# Patient Record
Sex: Male | Born: 2000 | Race: Black or African American | Hispanic: No | Marital: Single | Smoking: Never smoker
Health system: Southern US, Community
[De-identification: ages and names within clinical notes are randomized; demographics above are authoritative.]

## PROBLEM LIST (undated history)

## (undated) DIAGNOSIS — J45909 Unspecified asthma, uncomplicated: Secondary | ICD-10-CM

---
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
## 2019-02-15 ENCOUNTER — Encounter (HOSPITAL_COMMUNITY): Payer: Self-pay | Admitting: *Deleted

## 2019-02-15 ENCOUNTER — Emergency Department (HOSPITAL_COMMUNITY)
Admission: EM | Admit: 2019-02-15 | Discharge: 2019-02-15 | Disposition: A | Discharge status: Home / Self Care | Payer: 59 | Attending: Emergency Medicine | Admitting: Emergency Medicine

## 2019-02-15 ENCOUNTER — Other Ambulatory Visit: Payer: Self-pay

## 2019-02-15 DIAGNOSIS — J45909 Unspecified asthma, uncomplicated: Secondary | ICD-10-CM | POA: Diagnosis not present

## 2019-02-15 DIAGNOSIS — Z79899 Other long term (current) drug therapy: Secondary | ICD-10-CM | POA: Insufficient documentation

## 2019-02-15 DIAGNOSIS — R112 Nausea with vomiting, unspecified: Secondary | ICD-10-CM | POA: Insufficient documentation

## 2019-02-15 LAB — CBC WITH DIFFERENTIAL/PLATELET
Abs Immature Granulocytes: 0.01 10*3/uL (ref 0.00–0.07)
BASOS ABS: 0 10*3/uL (ref 0.0–0.1)
Basophils Relative: 1 %
EOS ABS: 0.1 10*3/uL (ref 0.0–0.5)
EOS PCT: 3 %
HCT: 51.1 % (ref 39.0–52.0)
HEMOGLOBIN: 16.8 g/dL (ref 13.0–17.0)
Immature Granulocytes: 0 %
LYMPHS PCT: 32 %
Lymphs Abs: 1.3 10*3/uL (ref 0.7–4.0)
MCH: 28.7 pg (ref 26.0–34.0)
MCHC: 32.9 g/dL (ref 30.0–36.0)
MCV: 87.2 fL (ref 80.0–100.0)
MONO ABS: 0.3 10*3/uL (ref 0.1–1.0)
Monocytes Relative: 8 %
NRBC: 0 % (ref 0.0–0.2)
Neutro Abs: 2.3 10*3/uL (ref 1.7–7.7)
Neutrophils Relative %: 56 %
Platelets: 243 10*3/uL (ref 150–400)
RBC: 5.86 MIL/uL — AB (ref 4.22–5.81)
RDW: 11.8 % (ref 11.5–15.5)
WBC: 4.1 10*3/uL (ref 4.0–10.5)

## 2019-02-15 LAB — BASIC METABOLIC PANEL
Anion gap: 9 (ref 5–15)
BUN: 8 mg/dL (ref 6–20)
CALCIUM: 9.7 mg/dL (ref 8.9–10.3)
CHLORIDE: 103 mmol/L (ref 98–111)
CO2: 26 mmol/L (ref 22–32)
CREATININE: 0.98 mg/dL (ref 0.61–1.24)
GFR calc non Af Amer: >60 mL/min (ref >60)
Glucose, Bld: 70 mg/dL (ref 70–99)
Potassium: 4.2 mmol/L (ref 3.5–5.1)
SODIUM: 138 mmol/L (ref 135–145)

## 2019-02-15 MED ORDER — ONDANSETRON HCL 4 MG PO TABS: 4.0000 mg | ORAL_TABLET | Freq: Four times a day (QID) | ORAL | 0 refills | Status: AC

## 2019-02-15 NOTE — Discharge Instructions (Addendum)
Blood work and EKG were normal.  Increase fluids.  Prescription for nausea medication E prescribed to your pharmacy.  No evidence of a concussion.

## 2019-02-15 NOTE — ED Triage Notes (Signed)
Pt complains of nausea, vomiting, sensitivity to light since passing out last week. Pt broke his tooth during the fall and is unsure if he hit his head.

## 2019-02-15 NOTE — ED Provider Notes (Addendum)
[facility]-EMERGENCY DEPT Provider Note   CSN: [id number] Arrival date & time: 02/15/19  1714    History   Chief Complaint Chief Complaint  Patient presents with  . Nausea  . Emesis  . Headache    HPI Isaac Barron is a 18 y.o. male.     Patient reports a syncopal spell last week.  He blames episode on being dehydrated.  This has never happened before.  He broke a upper central incisor with the fall.  He reports light sensitivity.  No neurological deficits, chest pain, dyspnea, palpitations.  He is normally healthy.  No medications.  No street drugs.     Past Medical History:  Diagnosis Date  . Asthma     There are no active problems to display for this patient.   History reviewed. No pertinent surgical history.      Home Medications    Prior to Admission medications   Medication Sig Start Date End Date Taking? Authorizing Provider  ibuprofen (ADVIL,MOTRIN) 200 MG tablet Take 400 mg by mouth every 6 (six) hours as needed for mild pain.    [provider]  ondansetron (ZOFRAN) 4 MG tablet Take 1 tablet (4 mg total) by mouth every 6 (six) hours. 02/15/19   Donnetta Hutching, MD    Family History No family history on file.  Social History Social History   Tobacco Use  . Smoking status: Never Smoker  . Smokeless tobacco: Never Used  Substance Use Topics  . Alcohol use: No  . Drug use: No     Allergies   Patient has no known allergies.   Review of Systems Review of Systems  All other systems reviewed and are negative.    Physical Exam Updated Vital Signs BP 118/72   Pulse (!) 58   Temp 99.1 F (37.3 C) (Oral)   Resp 19   SpO2 98%   Physical Exam Vitals signs and nursing note reviewed.  Constitutional:      Appearance: He is well-developed.  HENT:     Head: Normocephalic and atraumatic.     Comments: Chipped right central incisor Eyes:     Conjunctiva/sclera: Conjunctivae normal.  Neck:   Musculoskeletal: Neck supple.  Cardiovascular:     Rate and Rhythm: Normal rate and regular rhythm.  Pulmonary:     Effort: Pulmonary effort is normal.     Breath sounds: Normal breath sounds.  Abdominal:     General: Bowel sounds are normal.     Palpations: Abdomen is soft.  Musculoskeletal: Normal range of motion.  Skin:    General: Skin is warm and dry.  Neurological:     Mental Status: He is alert and oriented to person, place, and time.  Psychiatric:        Behavior: Behavior normal.      ED Treatments / Results  Labs (all labs ordered are listed, but only abnormal results are displayed) Labs Reviewed  CBC WITH DIFFERENTIAL/PLATELET - Abnormal; Notable for the following components:      Result Value   RBC 5.86 (*)    All other components within normal limits  BASIC METABOLIC PANEL    EKG EKG Interpretation  Date/Time:  Saturday February 15 2019 18:38:14 EDT Ventricular Rate:  67 PR Interval:    QRS Duration: 88 QT Interval:  392 QTC Calculation: 414 R Axis:   79 Text Interpretation:  Sinus rhythm LVH by voltage ST elev, probable normal early repol pattern Confirmed by Donnetta Hutching ([id number]) on  02/15/2019 7:13:20 PM   Radiology No results found.  Procedures Procedures (including critical care time)  Medications Ordered in ED Medications - No data to display   Initial Impression / Assessment and Plan / ED Course  I have reviewed the triage vital signs and the nursing notes.  Pertinent labs & imaging results that were available during my care of the patient were reviewed by me and considered in my medical decision making (see chart for details).        Patient reports a syncopal spell last week.  Normal neuro exam.  EKG and basic labs normal.  No evidence of a neurological event.  Final Clinical Impressions(s) / ED Diagnoses   Final diagnoses:  Intractable vomiting with nausea, unspecified vomiting type    ED Discharge Orders         Ordered     ondansetron (ZOFRAN) 4 MG tablet  Every 6 hours     02/15/19 1754           Donnetta Hutching, MD 02/15/19 Ninfa Linden    Donnetta Hutching, MD 02/16/19 [phone]

## 2020-11-11 ENCOUNTER — Ambulatory Visit: Payer: Medicaid Other | Attending: Internal Medicine

## 2020-11-11 DIAGNOSIS — Z23 Encounter for immunization: Secondary | ICD-10-CM

## 2020-11-11 NOTE — Progress Notes (Signed)
   Covid-19 Vaccination Clinic  Name:  Isaac Barron    MRN: [id number] DOB: 2001/04/28  11/11/2020  Isaac Barron was observed post Covid-19 immunization for 15 minutes without incident. He was provided with Vaccine Information Sheet and instruction to access the V-Safe system.   Isaac Barron was instructed to call 911 with any severe reactions post vaccine: Marland Kitchen Difficulty breathing  . Swelling of face and throat  . A fast heartbeat  . A bad rash all over body  . Dizziness and weakness   Immunizations Administered    Name Date Dose VIS Date Route   Pfizer COVID-19 Vaccine 11/11/2020  2:21 PM 0.3 mL 09/08/2020 Intramuscular   Manufacturer: [employer], [employer]   Lot: [id number]   NDC: [address]-6

## 2020-12-02 ENCOUNTER — Ambulatory Visit: Payer: Medicaid Other

## 2020-12-14 ENCOUNTER — Emergency Department (HOSPITAL_COMMUNITY)
Admission: EM | Admit: 2020-12-14 | Discharge: 2020-12-14 | Disposition: A | Discharge status: Home / Self Care | Payer: 59 | Attending: Emergency Medicine | Admitting: Emergency Medicine

## 2020-12-14 ENCOUNTER — Encounter (HOSPITAL_COMMUNITY): Payer: Self-pay

## 2020-12-14 ENCOUNTER — Emergency Department (HOSPITAL_COMMUNITY): Payer: 59

## 2020-12-14 DIAGNOSIS — S93401A Sprain of unspecified ligament of right ankle, initial encounter: Secondary | ICD-10-CM | POA: Insufficient documentation

## 2020-12-14 DIAGNOSIS — X501XXA Overexertion from prolonged static or awkward postures, initial encounter: Secondary | ICD-10-CM | POA: Diagnosis not present

## 2020-12-14 DIAGNOSIS — M25579 Pain in unspecified ankle and joints of unspecified foot: Secondary | ICD-10-CM

## 2020-12-14 DIAGNOSIS — Y9367 Activity, basketball: Secondary | ICD-10-CM | POA: Insufficient documentation

## 2020-12-14 DIAGNOSIS — S99911A Unspecified injury of right ankle, initial encounter: Secondary | ICD-10-CM | POA: Diagnosis present

## 2020-12-14 DIAGNOSIS — J45909 Unspecified asthma, uncomplicated: Secondary | ICD-10-CM | POA: Diagnosis not present

## 2020-12-14 NOTE — ED Provider Notes (Signed)
[facility]-EMERGENCY DEPT Provider Note   CSN: [id number] Arrival date & time: 12/14/20  1022     History Chief Complaint  Patient presents with  . Ankle Pain    Harry Shuck is a 20 y.o. male with PMH/o asthma who presents for evaluation of right ankle and foot pain after twisting it yesterday. He reports he was playing basketball and states that he thinks he twisted it and then his friend fell on top of it. He reports pain to the lateral malleolus and dorsal foot since then. He has been able to ambulate but reports worsening pain with ambulation. He elevated, applied ice and took ibuprofen last night. He denies any numbness, weakness, knee pain.   The history is provided by the patient.       Past Medical History:  Diagnosis Date  . Asthma     There are no problems to display for this patient.   History reviewed. No pertinent surgical history.     History reviewed. No pertinent family history.  Social History   Tobacco Use  . Smoking status: Never Smoker  . Smokeless tobacco: Never Used  Substance Use Topics  . Alcohol use: No  . Drug use: No    Home Medications Prior to Admission medications   Medication Sig Start Date End Date Taking? Authorizing Provider  ibuprofen (ADVIL,MOTRIN) 200 MG tablet Take 400 mg by mouth every 6 (six) hours as needed for mild pain.    [provider]  ondansetron (ZOFRAN) 4 MG tablet Take 1 tablet (4 mg total) by mouth every 6 (six) hours. 02/15/19   Donnetta Hutching, MD    Allergies    Patient has no known allergies.  Review of Systems   Review of Systems  Musculoskeletal:       Ankle pain  Neurological: Negative for weakness and numbness.  All other systems reviewed and are negative.   Physical Exam Updated Vital Signs BP 121/89   Pulse 68   Temp 98 F (36.7 C) (Oral)   Resp 18   Ht 5\' 7"  (1.702 m)   Wt 59 kg   SpO2 99%   BMI 20.36 kg/m   Physical Exam Vitals and nursing note  reviewed.  Constitutional:      Appearance: He is well-developed and well-nourished.  HENT:     Head: Normocephalic and atraumatic.  Eyes:     Extraocular Movements: EOM normal.  Cardiovascular:     Pulses:          Dorsalis pedis pulses are 2+ on the right side and 2+ on the left side.  Pulmonary:     Effort: Pulmonary effort is normal.  Musculoskeletal:     Cervical back: Normal range of motion.     Comments: Tenderness palpation of the lateral malleolus of the right ankle.  There are some mild overlying soft tissue swelling.  No deformity, crepitus noted.  No edema, erythema.  Dorsiflexion plantarflexion intact.  No bony tenderness of the distal tib-fib, proximal tib-fib, knee.  Mild tenderness in the dorsal aspect of the right foot.  No overlying warmth, erythema.  No deformity or crepitus noted.  He can fully flex and extend all 5 digits without difficulty.  Skin:    General: Skin is warm and dry.     Capillary Refill: Capillary refill takes less than 2 seconds.     Comments: The skin is intact to ankle/foot.  The foot is warm and well perfused with intact sensation  Neurological:  Comments: Sensation intact throughout all major nerve distributions of the feet      ED Results / Procedures / Treatments   Labs (all labs ordered are listed, but only abnormal results are displayed) Labs Reviewed - No data to display  EKG None  Radiology DG Ankle Complete Right  Result Date: 12/14/2020 CLINICAL DATA:  20 year old male with history of right-sided foot and ankle pain since yesterday. EXAM: RIGHT ANKLE - COMPLETE 3+ VIEW COMPARISON:  No priors. FINDINGS: There is no evidence of fracture, dislocation, or joint effusion. There is no evidence of arthropathy or other focal bone abnormality. Soft tissues are unremarkable. IMPRESSION: Negative. Electronically Signed   By: Trudie Reed M.D.   On: 12/14/2020 11:18   DG Foot Complete Right  Result Date: 12/14/2020 CLINICAL DATA:   Foot and ankle pain. EXAM: RIGHT FOOT COMPLETE - 3+ VIEW COMPARISON:  No prior. FINDINGS: There is no evidence of fracture or dislocation. There is no evidence of arthropathy or other focal bone abnormality. Soft tissues are unremarkable. IMPRESSION: No acute bony or joint abnormality. Electronically Signed   By: Maisie Fus  Register   On: 12/14/2020 11:18    Procedures Procedures   Medications Ordered in ED Medications - No data to display  ED Course  I have reviewed the triage vital signs and the nursing notes.  Pertinent labs & imaging results that were available during my care of the patient were reviewed by me and considered in my medical decision making (see chart for details).    MDM Rules/Calculators/A&P                           20 y.o. Judie Petit Presents with right ankle pain onsistent with an ankle sprain/strain.  Vital signs reviewed and stable. Patient is neurovascularly intact. Consider sprain vs fracture vs dislocation.  XRs ordered.   XR reviewed. Negative for any acute fracture or dislocation. Explained results to patient and discussed that there could be a ligamentous or muscular injury that cannot be picked up on XR. Plan to send patient home with a ASO splint and crutches and will provide ortho referral to be seen if there is no improvement in symptoms. At this time, patient exhibits no emergent life-threatening condition that require further evaluation in ED. Patient had ample opportunity for questions and discussion. All patient's questions were answered with full understanding. Strict return precautions discussed. Patient expresses understanding and agreement to plan.   Portions of this note were generated with Scientist, clinical (histocompatibility and immunogenetics). Dictation errors may occur despite best attempts at proofreading.  Final Clinical Impression(s) / ED Diagnoses Final diagnoses:  Pain in joint, ankle and foot  Sprain of right ankle, unspecified ligament, initial encounter    Rx / DC  Orders ED Discharge Orders    None       Rosana Hoes 12/14/20 1708    Linwood Dibbles, MD 12/15/20 1002

## 2020-12-14 NOTE — ED Triage Notes (Signed)
Pt arrived via walk in, c/o right ankle pain, states he rolled his ankle yesterday while playing basketball, someone fell on it. Denies any other issues. Painful with ambulation, (+CSM), no visible deformity.

## 2020-12-14 NOTE — ED Notes (Signed)
An After Visit Summary was printed and given to the patient. Discharge instructions given and no further questions at this time.  

## 2020-12-14 NOTE — Discharge Instructions (Signed)
Follow up with your Primary Care Doctor as needed.   You can take Tylenol or Ibuprofen as directed for pain. You can alternate Tylenol and Ibuprofen every 4 hours. If you take Tylenol at 1pm, then you can take Ibuprofen at 5pm. Then you can take Tylenol again at 9pm. Do not exceed 4000 mg of tylenol a day. Do not exceed 800 mg of ibuprofen a day.     Return to the Emergency Department immediately for any worsening pain, redness/swelling of the ankle, gray or blue color to the toes, numbness/weakness of toes or foot, difficulty walking or any other worsening or concerning symptoms.    Ankle sprain Ankle sprain occurs when the ligaments that hold the ankle joint to get her are stretched or torn. It may take 4-6 weeks to heal.  For activity: Use crutches with nonweightbearing for the first few days. Then, you may walk on your ankles as the pain allows, or as instructed. Start gradually with weight bearing on the affected ankle. Once you can walk pain free, then try jogging. When you can run forwards, then you can try moving side to side. If you cannot walk without crutches in one week, you need a recheck by your Family Doctor.  If you do not have a family doctor to followup with, you can see the list of phone numbers below. Please call today to make a followup appointment.   RICE therapy:  Routine Care for injuries  Rest, Ice, Compression, Elevation (RICE)  Rest is needed to allow your body to heal. Routine activities can be resumed when comfortable. Injury tendons and bones can take up to 6 weeks to heal. Tendons are cordlike structures that attach muscles and bones.  Ice following an injury helps keep the swelling down and reduce the pain. Put ice in a plastic bag. Place a towel between your skin and the bag of ice. Leave the ice on for 15-20 minutes, 3-4 times a day. Do this while awake, for the first 24-48 hours. After that continue as directed by your caregiver.  Compression helps keep  swelling down. It also gives support and helps with discomfort. If any lasting bandage has been applied, it should be removed and reapplied every 3-4 hours. It should not be applied tightly, but firmly enough to keep swelling down. Watch fingers or toes for swelling, discoloration, coldness, numbness or excessive pain. If any of these problems occur, removed the bandage and reapply loosely. Contact your caregiver if these problems continue.  Elevation helps reduce swelling and decrease your pain. With extremities such as the arms, hands, legs and feet, the injured area should be placed near or above the level of the heart if possible.   

## 2021-01-13 ENCOUNTER — Ambulatory Visit: Payer: Medicaid Other | Attending: Family

## 2021-01-13 DIAGNOSIS — Z23 Encounter for immunization: Secondary | ICD-10-CM

## 2021-04-28 NOTE — Progress Notes (Signed)
   Covid-19 Vaccination Clinic  Name:  Isaac Barron    MRN: [id number] DOB: 18-Dec-2000  04/28/2021  [name] was observed post Covid-19 immunization for 15 minutes without incident. He was provided with Vaccine Information Sheet and instruction to access the V-Safe system.   [name] was instructed to call 911 with any severe reactions post vaccine: Difficulty breathing  Swelling of face and throat  A fast heartbeat  A bad rash all over body  Dizziness and weakness   Immunizations Administered     Name Date Dose VIS Date Route   Pfizer COVID-19 Vaccine 01/13/2021  9:00 AM 0.3 mL 09/08/2020 Intramuscular   Manufacturer: [employer], [employer]   Lot: [id number]   NDC: [address]-6

## 2021-11-26 IMAGING — CR DG FOOT COMPLETE 3+V*R*
3 series · 3 of 3 positions shown · non-contrast
Comparison: No prior.

CLINICAL DATA: Foot and ankle pain.

EXAM:
RIGHT FOOT COMPLETE - 3+ VIEW

[x foot ap right]
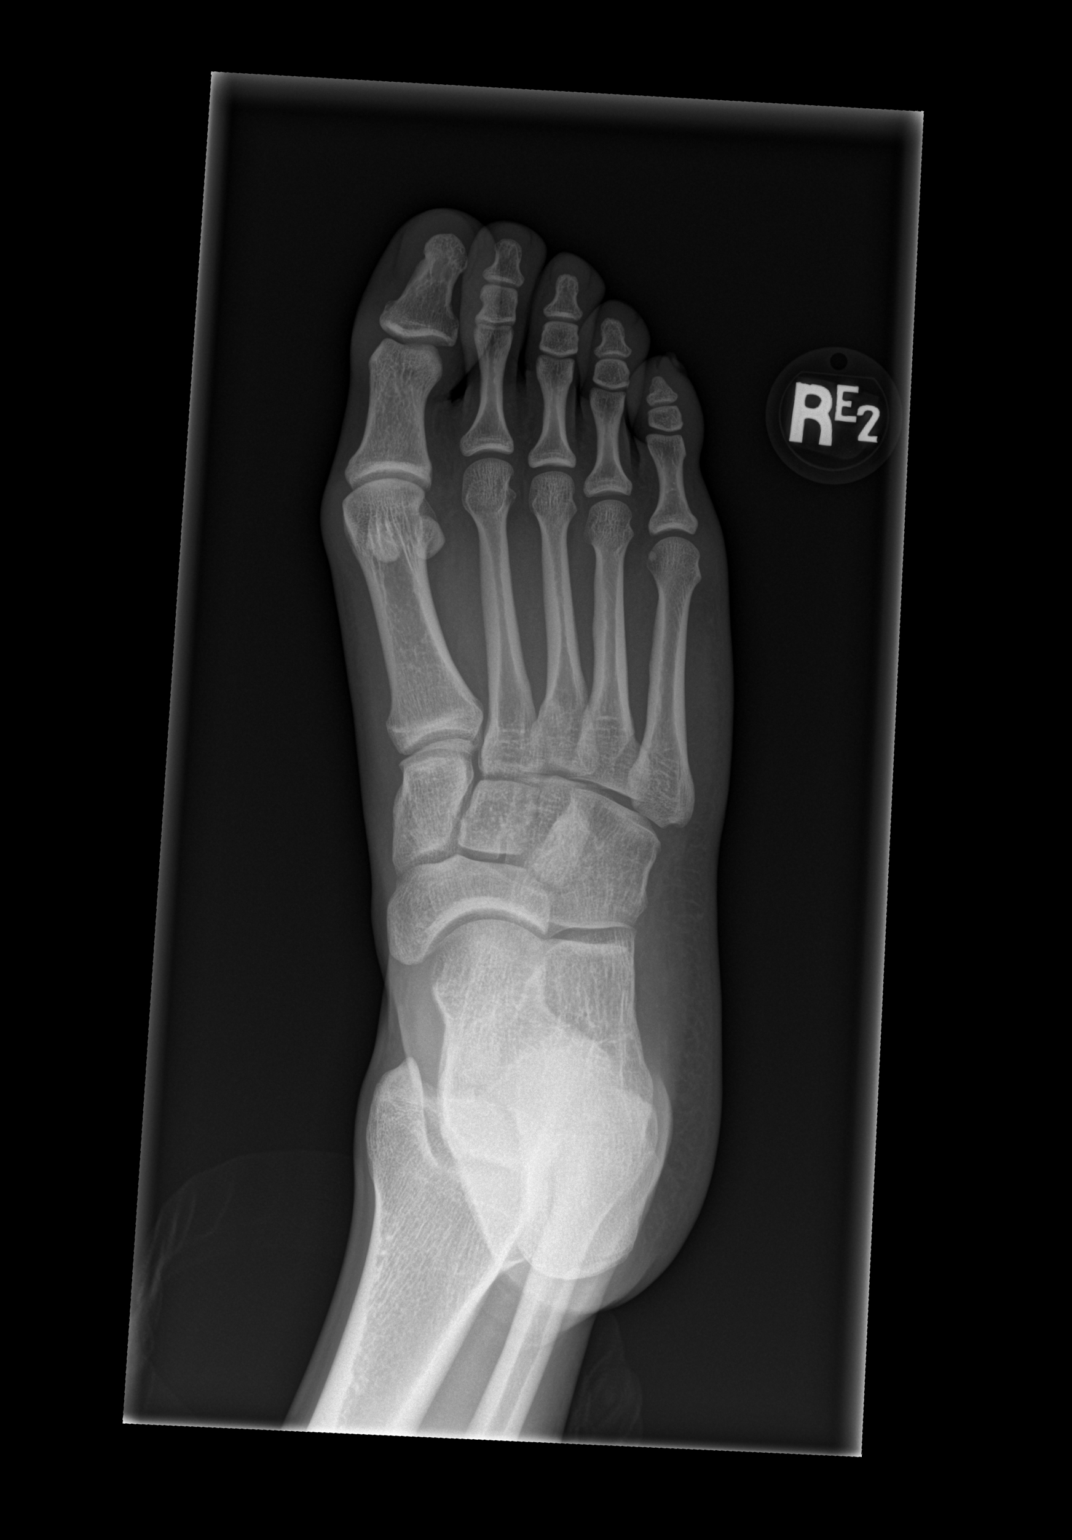

[x foot obl right]
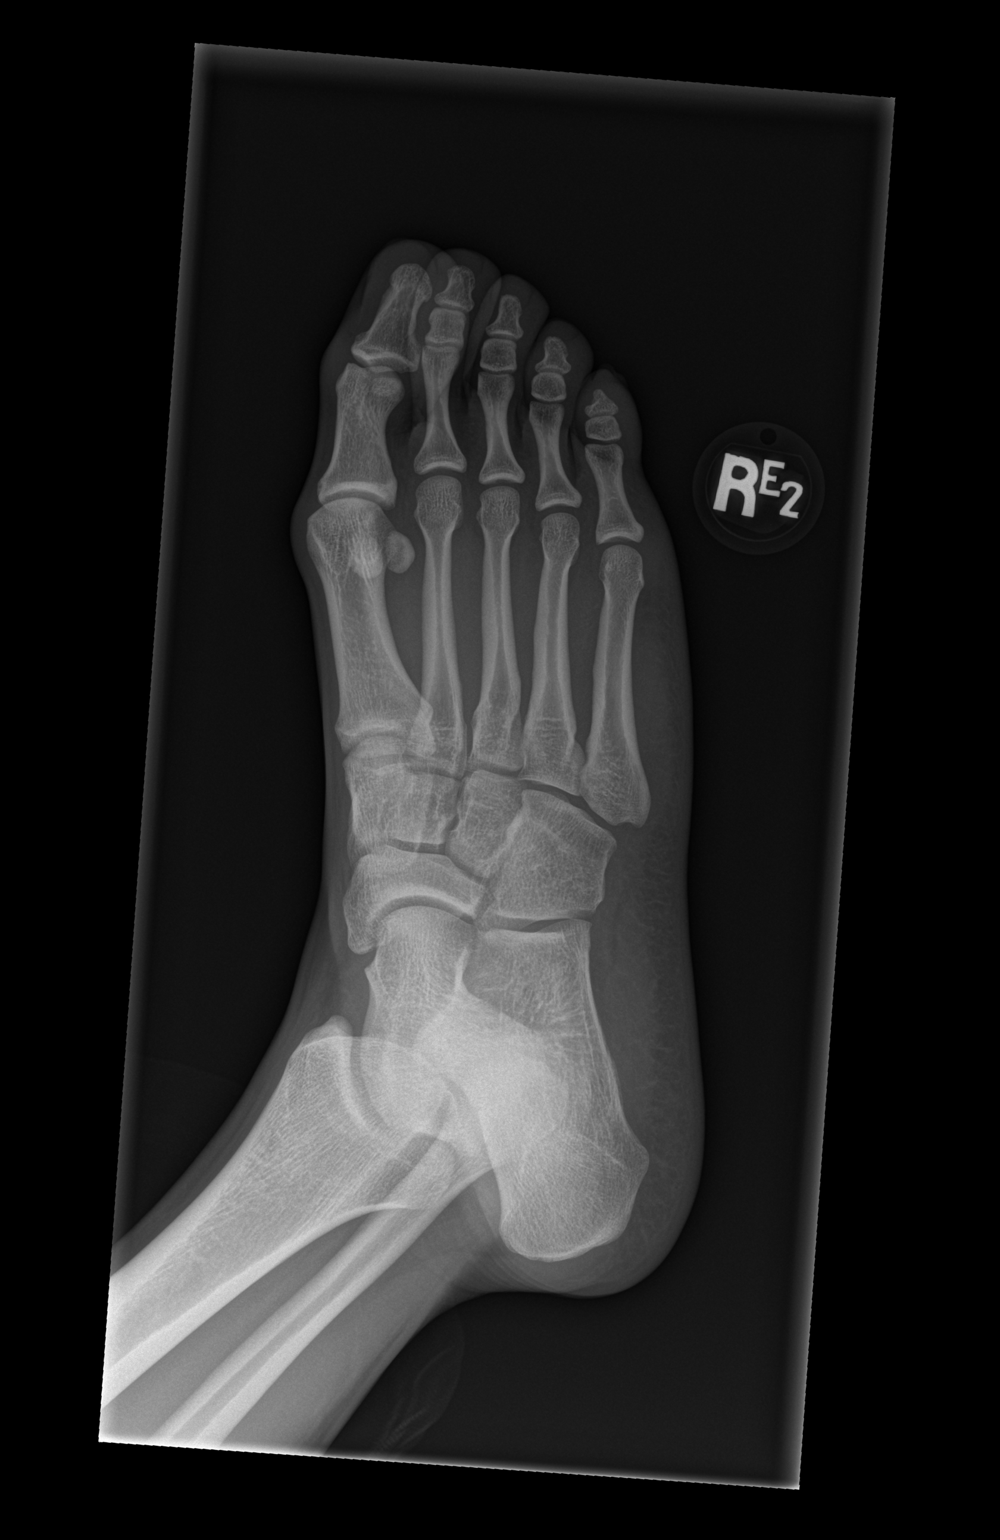

[x foot lat right]
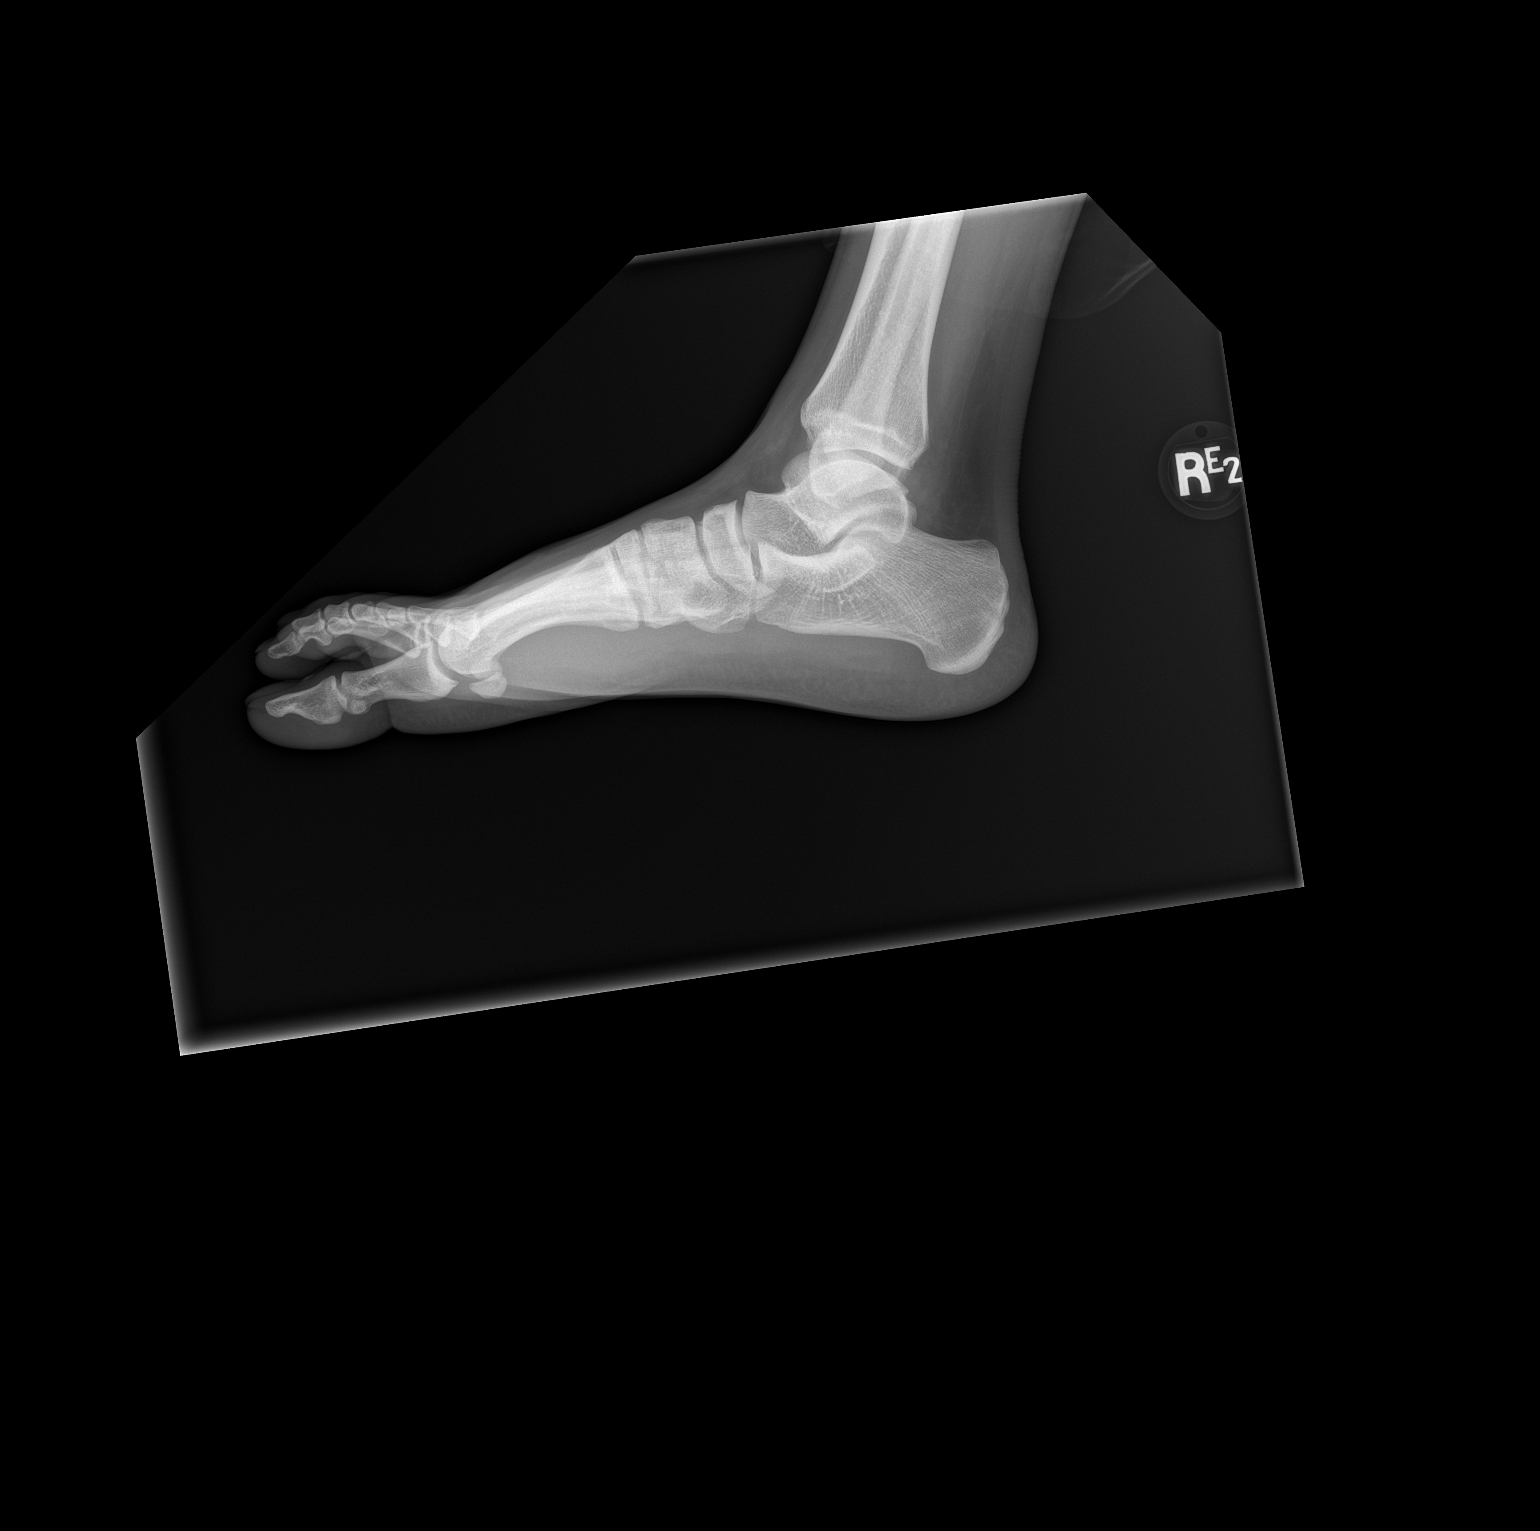

[3 of 3 positions shown; findings below may reference images not displayed]

FINDINGS: There is no evidence of fracture or dislocation. There is no
evidence of arthropathy or other focal bone abnormality. Soft
tissues are unremarkable.
IMPRESSION: No acute bony or joint abnormality.

## 2021-11-26 IMAGING — CR DG ANKLE COMPLETE 3+V*R*
3 series · 3 of 3 positions shown · non-contrast
Comparison: No priors.

CLINICAL DATA: 19-year-old male with history of right-sided foot
and ankle pain since yesterday.

EXAM:
RIGHT ANKLE - COMPLETE 3+ VIEW

[x ankle ap right]
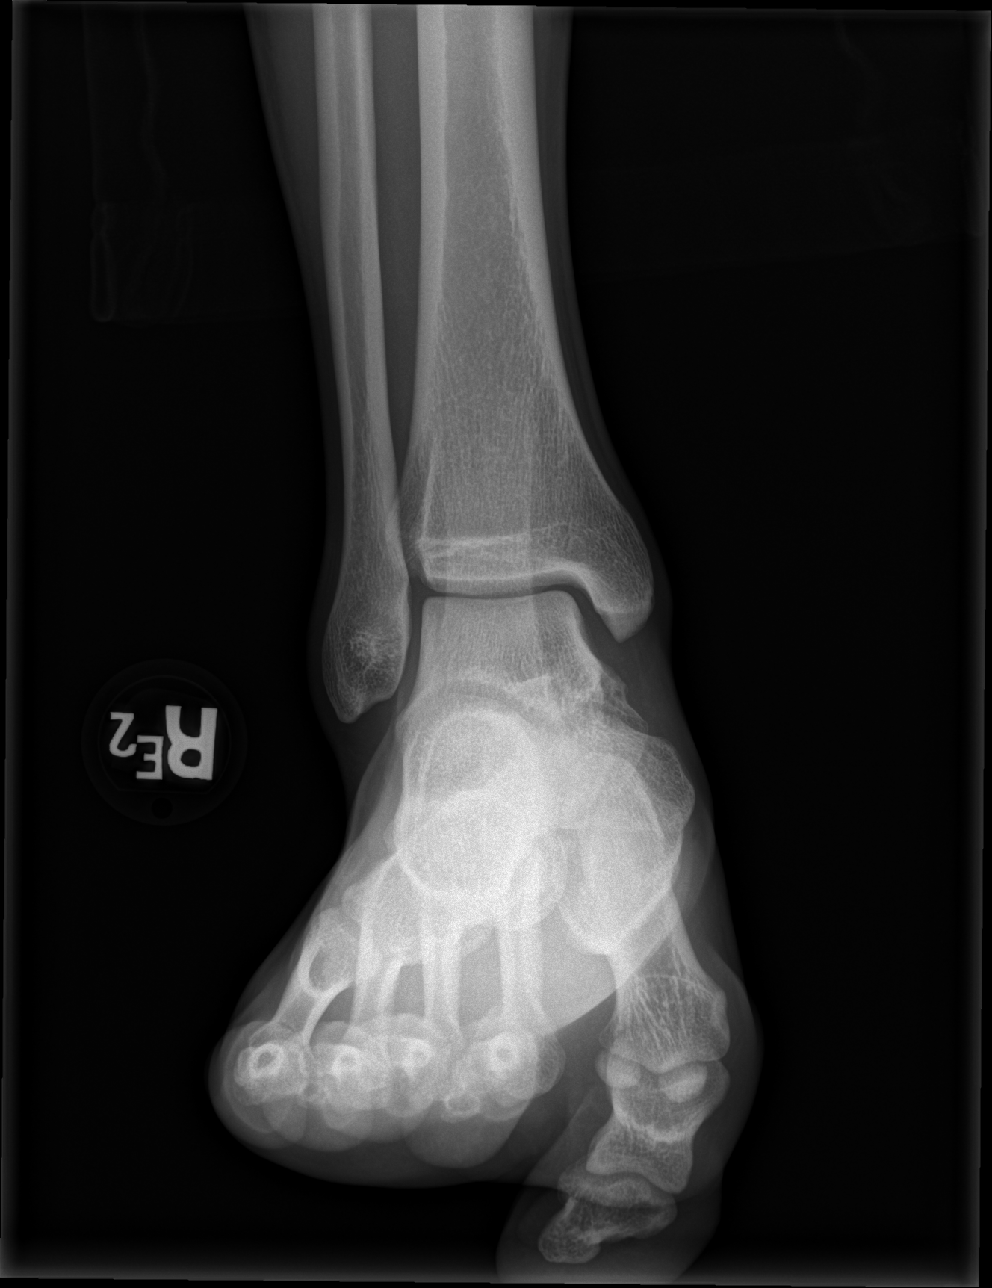

[x ankle obl right]
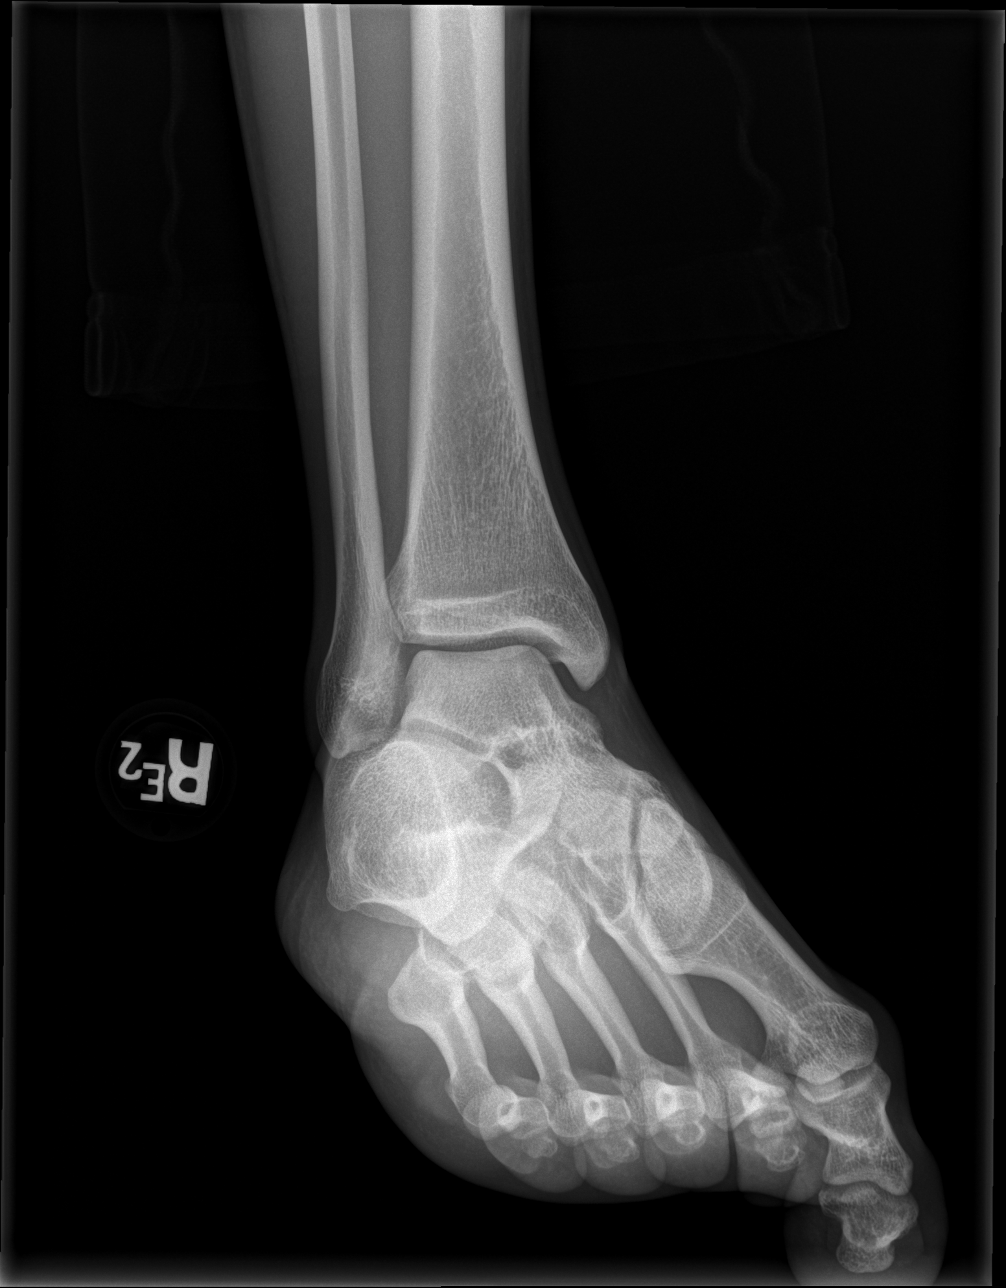

[x ankle lat right]
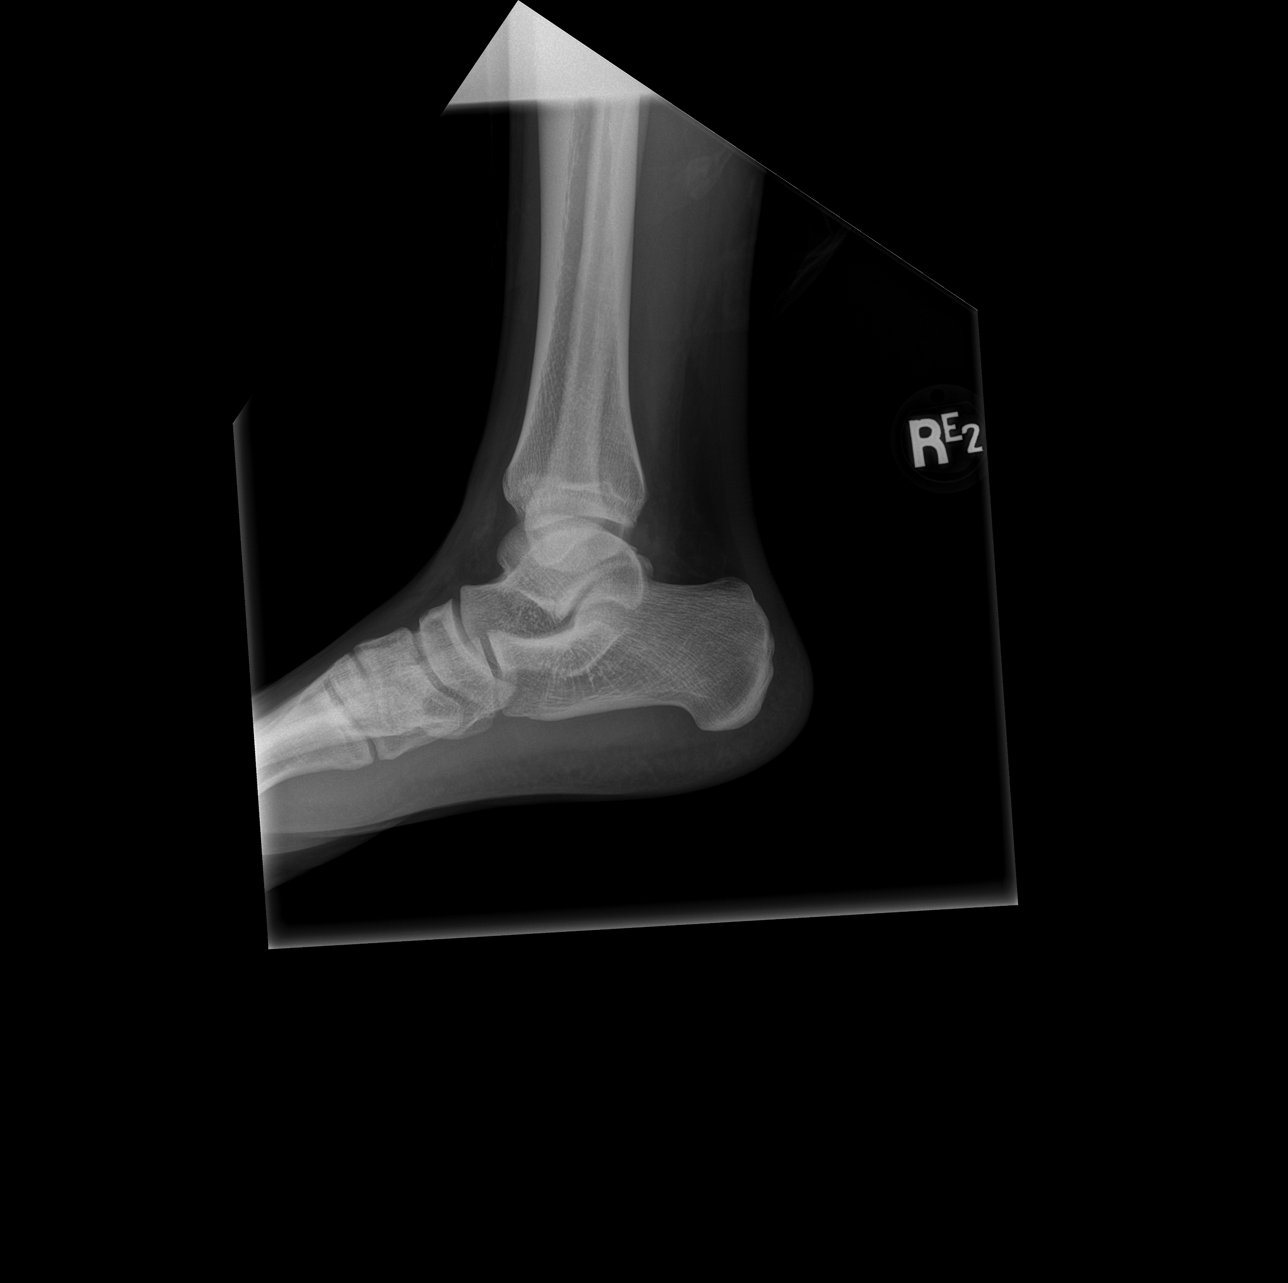

[3 of 3 positions shown; findings below may reference images not displayed]

FINDINGS: There is no evidence of fracture, dislocation, or joint effusion.
There is no evidence of arthropathy or other focal bone abnormality.
Soft tissues are unremarkable.
IMPRESSION: Negative.

## 2022-06-23 ENCOUNTER — Encounter (HOSPITAL_BASED_OUTPATIENT_CLINIC_OR_DEPARTMENT_OTHER): Payer: Self-pay | Admitting: Emergency Medicine

## 2022-06-23 ENCOUNTER — Other Ambulatory Visit: Payer: Self-pay

## 2022-06-23 ENCOUNTER — Emergency Department (HOSPITAL_BASED_OUTPATIENT_CLINIC_OR_DEPARTMENT_OTHER)
Admission: EM | Admit: 2022-06-23 | Discharge: 2022-06-23 | Disposition: A | Discharge status: Home / Self Care | Payer: Medicaid Other | Attending: Emergency Medicine | Admitting: Emergency Medicine

## 2022-06-23 ENCOUNTER — Other Ambulatory Visit (HOSPITAL_BASED_OUTPATIENT_CLINIC_OR_DEPARTMENT_OTHER): Payer: Self-pay

## 2022-06-23 DIAGNOSIS — D72819 Decreased white blood cell count, unspecified: Secondary | ICD-10-CM | POA: Diagnosis not present

## 2022-06-23 DIAGNOSIS — R1084 Generalized abdominal pain: Secondary | ICD-10-CM

## 2022-06-23 DIAGNOSIS — R112 Nausea with vomiting, unspecified: Secondary | ICD-10-CM

## 2022-06-23 DIAGNOSIS — K921 Melena: Secondary | ICD-10-CM | POA: Diagnosis not present

## 2022-06-23 LAB — RAPID URINE DRUG SCREEN, HOSP PERFORMED
Amphetamines: NOT DETECTED
Barbiturates: NOT DETECTED
Benzodiazepines: NOT DETECTED
Cocaine: NOT DETECTED
Opiates: NOT DETECTED
Tetrahydrocannabinol: POSITIVE — AB

## 2022-06-23 LAB — CBC WITH DIFFERENTIAL/PLATELET
Abs Immature Granulocytes: 0 10*3/uL (ref 0.00–0.07)
Basophils Absolute: 0 10*3/uL (ref 0.0–0.1)
Basophils Relative: 1 %
Eosinophils Absolute: 0.1 10*3/uL (ref 0.0–0.5)
Eosinophils Relative: 4 %
HCT: 42.1 % (ref 39.0–52.0)
Hemoglobin: 14.1 g/dL (ref 13.0–17.0)
Immature Granulocytes: 0 %
Lymphocytes Relative: 50 %
Lymphs Abs: 1.4 10*3/uL (ref 0.7–4.0)
MCH: 29.6 pg (ref 26.0–34.0)
MCHC: 33.5 g/dL (ref 30.0–36.0)
MCV: 88.4 fL (ref 80.0–100.0)
Monocytes Absolute: 0.3 10*3/uL (ref 0.1–1.0)
Monocytes Relative: 10 %
Neutro Abs: 1 10*3/uL — ABNORMAL LOW (ref 1.7–7.7)
Neutrophils Relative %: 35 %
Platelets: 203 10*3/uL (ref 150–400)
RBC: 4.76 MIL/uL (ref 4.22–5.81)
RDW: 11.9 % (ref 11.5–15.5)
WBC: 2.8 10*3/uL — ABNORMAL LOW (ref 4.0–10.5)
nRBC: 0 % (ref 0.0–0.2)

## 2022-06-23 LAB — COMPREHENSIVE METABOLIC PANEL
ALT: 31 U/L (ref 0–44)
AST: 35 U/L (ref 15–41)
Albumin: 3.9 g/dL (ref 3.5–5.0)
Alkaline Phosphatase: 41 U/L (ref 38–126)
Anion gap: 3 — ABNORMAL LOW (ref 5–15)
BUN: 11 mg/dL (ref 6–20)
CO2: 26 mmol/L (ref 22–32)
Calcium: 8.3 mg/dL — ABNORMAL LOW (ref 8.9–10.3)
Chloride: 110 mmol/L (ref 98–111)
Creatinine, Ser: 0.89 mg/dL (ref 0.61–1.24)
GFR, Estimated: >60 mL/min (ref >60)
Glucose, Bld: 87 mg/dL (ref 70–99)
Potassium: 3.7 mmol/L (ref 3.5–5.1)
Sodium: 139 mmol/L (ref 135–145)
Total Bilirubin: 0.3 mg/dL (ref 0.3–1.2)
Total Protein: 6.5 g/dL (ref 6.5–8.1)

## 2022-06-23 LAB — URINALYSIS, ROUTINE W REFLEX MICROSCOPIC
Bilirubin Urine: NEGATIVE
Glucose, UA: NEGATIVE mg/dL
Hgb urine dipstick: NEGATIVE
Ketones, ur: NEGATIVE mg/dL
Leukocytes,Ua: NEGATIVE
Nitrite: NEGATIVE
Protein, ur: NEGATIVE mg/dL
Specific Gravity, Urine: 1.02 (ref 1.005–1.030)
pH: 7 (ref 5.0–8.0)

## 2022-06-23 LAB — LIPASE, BLOOD: Lipase: 23 U/L (ref 11–51)

## 2022-06-23 MED ORDER — ONDANSETRON 4 MG PO TBDP
4.0000 mg | ORAL_TABLET | ORAL | 0 refills | Status: AC | PRN
  Filled 2022-06-23: qty 20, 4d supply, fill #0

## 2022-06-23 MED ORDER — FAMOTIDINE 20 MG PO TABS
20.0000 mg | ORAL_TABLET | Freq: Two times a day (BID) | ORAL | 0 refills | Status: AC
  Filled 2022-06-23: qty 30, 15d supply, fill #0

## 2022-06-23 NOTE — ED Triage Notes (Signed)
States woke this am with lower abd pain, vomited x 2 and had a stool with "blood " in it. States was a hard stool, thinks it was a light red. Family member sick with GI symp.

## 2022-06-23 NOTE — Discharge Instructions (Addendum)
1.  You may have a stomach virus.  However, regular marijuana use can also result in abdominal pain and vomiting.  Additional educational information has been provided.  Also, review return instructions for abdominal pain and return if you have new worsening or concerning symptoms. 2.  Take Pepcid twice daily for the next 2 weeks to see if your symptoms improve.  This medicine decreases acid secretion in the stomach and should decrease nausea and make it easier for you to eat.  Take Zofran dissolvable under the tongue if you are having significant nausea or vomiting. 3.  You should have a recheck with the family doctor.  If you do not have a family doctor, use the referral number in your discharge instructions to help find one.

## 2022-06-23 NOTE — ED Provider Notes (Signed)
[facility] EMERGENCY DEPARTMENT Provider Note   CSN: [id number] Arrival date & time: 06/23/22  1124     History  Chief Complaint  Patient presents with   Abdominal Pain    Isaac Barron is a 21 y.o. male.  HPI Patient reports that he has had down discomfort for about 2 days.  It was diffuse in his central abdomen.  Crampy in nature.  Patient reports that he has had a couple of bowel movements.  Reports the stool is initially hard but then he had to go a lot.  He reports he did see some bright red blood mixed with stool.  As well patient reports that he vomited about 2 times today.  His companion notes that he seems to be nauseated and might have some vomiting in the mornings more regularly.  She has not had fever or chills.  No pain burning urgency of urination.  He does note that his roommate seemed to have a stomach infection earlier in the week and he thinks he might of caught something.  Patient does report nearly daily marijuana use.  No prior issues with recurrent vomiting or abdominal pain.    Home Medications Prior to Admission medications   Medication Sig Start Date End Date Taking? Authorizing Provider  famotidine (PEPCID) 20 MG tablet Take 1 tablet (20 mg total) by mouth 2 (two) times daily. 06/23/22  Yes Arby Barrette, MD  ondansetron (ZOFRAN-ODT) 4 MG disintegrating tablet Take 1 tablet (4 mg total) by mouth every 4 (four) hours as needed for nausea or vomiting. 06/23/22  Yes Arby Barrette, MD  ibuprofen (ADVIL,MOTRIN) 200 MG tablet Take 400 mg by mouth every 6 (six) hours as needed for mild pain.    [provider]  ondansetron (ZOFRAN) 4 MG tablet Take 1 tablet (4 mg total) by mouth every 6 (six) hours. 02/15/19   Donnetta Hutching, MD      Allergies    Patient has no known allergies.    Review of Systems   Review of Systems 10 systems reviewed negative except as per HPI Physical Exam Updated Vital Signs BP 111/68   Pulse (!) 59   Temp 98 F (36.7  C) (Oral)   Resp 16   Ht 5\' 7"  (1.702 m)   Wt 61.2 kg   SpO2 100%   BMI 21.14 kg/m  Physical Exam Constitutional:      Comments: Well-nourished well-developed.  Patient is thin and in good physical condition.  No acute distress.  HENT:     Mouth/Throat:     Pharynx: Oropharynx is clear.  Eyes:     Extraocular Movements: Extraocular movements intact.  Cardiovascular:     Rate and Rhythm: Normal rate and regular rhythm.  Pulmonary:     Effort: Pulmonary effort is normal.     Breath sounds: Normal breath sounds.  Abdominal:     General: There is no distension.     Palpations: Abdomen is soft.     Tenderness: There is no abdominal tenderness. There is no guarding.  Musculoskeletal:        General: No swelling or tenderness. Normal range of motion.     Right lower leg: No edema.     Left lower leg: No edema.  Skin:    General: Skin is warm and dry.  Neurological:     General: No focal deficit present.     Mental Status: He is oriented to person, place, and time.     Motor: No  weakness.     Coordination: Coordination normal.  Psychiatric:        Mood and Affect: Mood normal.     ED Results / Procedures / Treatments   Labs (all labs ordered are listed, but only abnormal results are displayed) Labs Reviewed  CBC WITH DIFFERENTIAL/PLATELET - Abnormal; Notable for the following components:      Result Value   WBC 2.8 (*)    Neutro Abs 1.0 (*)    All other components within normal limits  COMPREHENSIVE METABOLIC PANEL - Abnormal; Notable for the following components:   Calcium 8.3 (*)    Anion gap 3 (*)    All other components within normal limits  URINALYSIS, ROUTINE W REFLEX MICROSCOPIC  LIPASE, BLOOD  RAPID URINE DRUG SCREEN, HOSP PERFORMED    EKG None  Radiology No results found.  Procedures Procedures    Medications Ordered in ED Medications - No data to display  ED Course/ Medical Decision Making/ A&P                           Medical Decision  Making Amount and/or Complexity of Data Reviewed Labs: ordered.   Patient presents as outlined.  Clinically he is well in appearance.  He does not have significant risk factors.  Abdominal pain is diffuse, mostly crampy in nature.  At this time low suspicion for acute surgical abdomen.  Differential diagnosis includes viral gastroenteritis\can have annoyed hyperemesis\constipation with minor rectal bleeding.  Will review basic lab work.  Urinalysis negative.  Patient has very minor leukopenia at 2.8 with normal H&H and normal platelets.  Basic metabolic panel with LFTs normal.  No clinical signs of dehydration on physical exam or lab work.  At this time, with patient clinically well in appearance and stable lab work, my recommendation will be for twice daily Pepcid for 2 weeks, paying attention to bowel movements if they are firm and he is straining take Colace twice daily.  I have counseled on marijuana cessation due to possibility of cannabinoid hyperemesis and abdominal pain.  Also possible this is a viral gastroenteritis although patient has limited amount of vomiting and limited amount of stool.  I explained that gastroenteritis is typically self-limiting and we reviewed return precautions.          Final Clinical Impression(s) / ED Diagnoses Final diagnoses:  Generalized abdominal pain  Nausea and vomiting, unspecified vomiting type  Blood in stool    Rx / DC Orders ED Discharge Orders          Ordered    famotidine (PEPCID) 20 MG tablet  2 times daily        06/23/22 1318    ondansetron (ZOFRAN-ODT) 4 MG disintegrating tablet  Every 4 hours PRN        06/23/22 1318              Arby Barrette, MD 06/23/22 1329

## 2022-08-12 ENCOUNTER — Encounter (HOSPITAL_BASED_OUTPATIENT_CLINIC_OR_DEPARTMENT_OTHER): Payer: Self-pay | Admitting: Emergency Medicine

## 2022-08-12 ENCOUNTER — Other Ambulatory Visit: Payer: Self-pay

## 2022-08-12 ENCOUNTER — Emergency Department (HOSPITAL_BASED_OUTPATIENT_CLINIC_OR_DEPARTMENT_OTHER): Payer: Medicaid Other

## 2022-08-12 ENCOUNTER — Emergency Department (HOSPITAL_BASED_OUTPATIENT_CLINIC_OR_DEPARTMENT_OTHER)
Admission: EM | Admit: 2022-08-12 | Discharge: 2022-08-12 | Disposition: A | Discharge status: Home / Self Care | Payer: Medicaid Other | Attending: Emergency Medicine | Admitting: Emergency Medicine

## 2022-08-12 DIAGNOSIS — M5442 Lumbago with sciatica, left side: Secondary | ICD-10-CM

## 2022-08-12 DIAGNOSIS — M79605 Pain in left leg: Secondary | ICD-10-CM | POA: Diagnosis not present

## 2022-08-12 MED ORDER — OXYCODONE HCL 5 MG PO TABS
5.0000 mg | ORAL_TABLET | Freq: Once | ORAL | Status: AC
  Administered 2022-08-12: 5 mg via ORAL
  Filled 2022-08-12: qty 1

## 2022-08-12 MED ORDER — KETOROLAC TROMETHAMINE 15 MG/ML IJ SOLN
15.0000 mg | Freq: Once | INTRAMUSCULAR | Status: AC
  Administered 2022-08-12: 15 mg via INTRAMUSCULAR
  Filled 2022-08-12: qty 1

## 2022-08-12 MED ORDER — DIAZEPAM 5 MG PO TABS
5.0000 mg | ORAL_TABLET | Freq: Once | ORAL | Status: AC
  Administered 2022-08-12: 5 mg via ORAL
  Filled 2022-08-12: qty 1

## 2022-08-12 MED ORDER — ACETAMINOPHEN 500 MG PO TABS
1000.0000 mg | ORAL_TABLET | Freq: Once | ORAL | Status: AC
  Administered 2022-08-12: 1000 mg via ORAL
  Filled 2022-08-12: qty 2

## 2022-08-12 NOTE — ED Triage Notes (Addendum)
Back pain x 1 1/2 week  had some tingling  started yesterday

## 2022-08-12 NOTE — ED Provider Notes (Signed)
[facility] EMERGENCY DEPARTMENT Provider Note   CSN: [id number] Arrival date & time: 08/12/22  1846     History  Chief Complaint  Patient presents with   Back Pain    Isaac Barron is a 21 y.o. male.  21 yo M with a chief complaints of low back pain.  Patient states that this started when he was playing with his friends and he jumped and struck his back against the doorknob.  He started having some pain going down the left leg.  This is down the back part of the leg.  Seems to happen off and on.  He denies loss of bowel or bladder denies also.  Patient denies numbness or weakness to the leg.   Back Pain      Home Medications Prior to Admission medications   Medication Sig Start Date End Date Taking? Authorizing Provider  famotidine (PEPCID) 20 MG tablet Take 1 tablet (20 mg total) by mouth 2 (two) times daily. 06/23/22   Arby Barrette, MD  ibuprofen (ADVIL,MOTRIN) 200 MG tablet Take 400 mg by mouth every 6 (six) hours as needed for mild pain.    [provider]  ondansetron (ZOFRAN) 4 MG tablet Take 1 tablet (4 mg total) by mouth every 6 (six) hours. 02/15/19   Donnetta Hutching, MD  ondansetron (ZOFRAN-ODT) 4 MG disintegrating tablet Take 1 tablet (4 mg total) by mouth every 4 (four) hours as needed for nausea or vomiting. 06/23/22   Arby Barrette, MD      Allergies    Patient has no known allergies.    Review of Systems   Review of Systems  Musculoskeletal:  Positive for back pain.    Physical Exam Updated Vital Signs BP 127/65 (BP Location: Right Arm)   Pulse 63   Temp 98.8 F (37.1 C) (Oral)   Resp 18   Ht 5\' 7"  (1.702 m)   Wt 58.9 kg   SpO2 97%   BMI 20.35 kg/m  Physical Exam Vitals and nursing note reviewed.  Constitutional:      Appearance: He is well-developed.  HENT:     Head: Normocephalic and atraumatic.  Eyes:     Pupils: Pupils are equal, round, and reactive to light.  Neck:     Vascular: No JVD.  Cardiovascular:     Rate  and Rhythm: Normal rate and regular rhythm.     Heart sounds: No murmur heard.    No friction rub. No gallop.  Pulmonary:     Effort: No respiratory distress.     Breath sounds: No wheezing.  Abdominal:     General: There is no distension.     Tenderness: There is no abdominal tenderness. There is no guarding or rebound.  Musculoskeletal:        General: Normal range of motion.     Cervical back: Normal range of motion and neck supple.     Comments: No obvious midline spinal tenderness step-offs or deformities.  Pulse motor and sensation intact in left lower extremity.  Reflexes are 2+ and equal.  No clonus.  Negative straight leg raise test.  Skin:    Coloration: Skin is not pale.     Findings: No rash.  Neurological:     Mental Status: He is alert and oriented to person, place, and time.  Psychiatric:        Behavior: Behavior normal.     ED Results / Procedures / Treatments   Labs (all labs ordered are listed,  but only abnormal results are displayed) Labs Reviewed - No data to display  EKG None  Radiology DG Lumbar Spine Complete  Result Date: 08/12/2022 CLINICAL DATA:  Low back pain, fall playing basketball EXAM: LUMBAR SPINE - COMPLETE 4+ VIEW COMPARISON:  None Available. FINDINGS: There is no evidence of lumbar spine fracture. Alignment is normal. Intervertebral disc spaces are maintained. IMPRESSION: Negative. Electronically Signed   By: Rolm Baptise M.D.   On: 08/12/2022 19:49    Procedures Procedures    Medications Ordered in ED Medications  acetaminophen (TYLENOL) tablet 1,000 mg (has no administration in time range)  diazepam (VALIUM) tablet 5 mg (has no administration in time range)  oxyCODONE (Oxy IR/ROXICODONE) immediate release tablet 5 mg (5 mg Oral Given 08/12/22 2008)  ketorolac (TORADOL) 15 MG/ML injection 15 mg (15 mg Intramuscular Given 08/12/22 2008)    ED Course/ Medical Decision Making/ A&P                           Medical Decision  Making Amount and/or Complexity of Data Reviewed Radiology: ordered.  Risk OTC drugs. Prescription drug management.   21 yo M with a chief complaint of low back pain that radiates down the leg.  Has been going on since he jumped in bumped into a doorknob.  Head is traumatic we will obtain a plain film.  Most likely sciatica based on history and physical.  No red flags otherwise.  Treat pain here.  X-ray independently turbid by me without fracture.  Will discharge home.  PCP follow-up.  As patient does not have PCP given sports medicine follow-up.  8:08 PM:  I have discussed the diagnosis/risks/treatment options with the patient.  Evaluation and diagnostic testing in the emergency department does not suggest an emergent condition requiring admission or immediate intervention beyond what has been performed at this time.  They will follow up with PCP, sports med. We also discussed returning to the ED immediately if new or worsening sx occur. We discussed the sx which are most concerning (e.g., sudden worsening pain, fever, inability to tolerate by mouth, cauda equina s/sx) that necessitate immediate return. Medications administered to the patient during their visit and any new prescriptions provided to the patient are listed below.  Medications given during this visit Medications  acetaminophen (TYLENOL) tablet 1,000 mg (has no administration in time range)  diazepam (VALIUM) tablet 5 mg (has no administration in time range)  oxyCODONE (Oxy IR/ROXICODONE) immediate release tablet 5 mg (5 mg Oral Given 08/12/22 2008)  ketorolac (TORADOL) 15 MG/ML injection 15 mg (15 mg Intramuscular Given 08/12/22 2008)     The patient appears reasonably screen and/or stabilized for discharge and I doubt any other medical condition or other [facility] requiring further screening, evaluation, or treatment in the ED at this time prior to discharge.          Final Clinical Impression(s) / ED Diagnoses Final  diagnoses:  Acute left-sided low back pain with left-sided sciatica    Rx / DC Orders ED Discharge Orders     None         Deno Etienne, DO 08/12/22 2008

## 2022-08-12 NOTE — Discharge Instructions (Signed)

## 2023-01-27 ENCOUNTER — Other Ambulatory Visit: Payer: Self-pay

## 2023-01-27 ENCOUNTER — Emergency Department (HOSPITAL_COMMUNITY): Payer: Medicaid Other

## 2023-01-27 ENCOUNTER — Encounter (HOSPITAL_COMMUNITY): Payer: Self-pay | Admitting: *Deleted

## 2023-01-27 ENCOUNTER — Emergency Department (HOSPITAL_COMMUNITY): Payer: No Typology Code available for payment source

## 2023-01-27 ENCOUNTER — Emergency Department (HOSPITAL_COMMUNITY)
Admission: EM | Admit: 2023-01-27 | Discharge: 2023-01-28 | Disposition: A | Discharge status: Home / Self Care | Payer: No Typology Code available for payment source | Attending: Emergency Medicine | Admitting: Emergency Medicine

## 2023-01-27 DIAGNOSIS — W231XXA Caught, crushed, jammed, or pinched between stationary objects, initial encounter: Secondary | ICD-10-CM | POA: Diagnosis not present

## 2023-01-27 DIAGNOSIS — M79672 Pain in left foot: Secondary | ICD-10-CM | POA: Insufficient documentation

## 2023-01-27 MED ORDER — OXYCODONE-ACETAMINOPHEN 5-325 MG PO TABS
1.0000 | ORAL_TABLET | Freq: Once | ORAL | Status: AC
  Administered 2023-01-27: 1 via ORAL
  Filled 2023-01-27: qty 1

## 2023-01-27 NOTE — ED Triage Notes (Signed)
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

## 2023-01-27 NOTE — ED Provider Notes (Signed)
[facility] Emergency Department Provider Note MRN:  [id number]  Arrival date & time: 01/28/23     Chief Complaint   Foot Injury   History of Present Illness   Isaac Barron is a 22 y.o. year-old male presents to the ED with chief complaint of left foot pain.  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  He complains of left heel pain.  Worse with walking and with palpation.  History provided by patient.   Review of Systems  Pertinent positive and negative review of systems noted in HPI.    Physical Exam   Vitals:   01/27/23 2157  BP: 129/73  Pulse: 76  Resp: 15  Temp: 98.8 F (37.1 C)  SpO2: 97%    CONSTITUTIONAL:  well-appearing, NAD NEURO:  Alert and oriented x 3, CN 3-12 grossly intact EYES:  eyes equal and reactive ENT/NECK:  Supple, no stridor  CARDIO:  normal rate, regular rhythm, appears well-perfused  PULM:  No respiratory distress,  GI/GU:  non-distended,  MSK/SPINE:  No gross deformities, no edema, moves all extremities, left heel TTP SKIN:  no rash, bruising around medial left heel   *Additional and/or pertinent findings included in MDM below  Diagnostic and Interventional Summary    EKG Interpretation  Date/Time:    Ventricular Rate:    PR Interval:    QRS Duration:   QT Interval:    QTC Calculation:   R Axis:     Text Interpretation:         Labs Reviewed - No data to display  CT Foot Left Wo Contrast  Final Result    DG Foot Complete Left  Final Result      Medications  oxyCODONE-acetaminophen (PERCOCET/ROXICET) 5-325 MG per tablet 1 tablet (1 tablet Oral Given 01/27/23 2342)     Procedures  /  Critical Care Procedures  ED Course and Medical Decision Making  I have reviewed the triage vital signs, the nursing notes, and pertinent available records from the EMR.  Social Determinants Affecting Complexity of  Care: Patient has no clinically significant social determinants affecting this chief complaint..   ED Course:    Medical Decision Making Patient here with left heel pain after getting it caught between 2 machines at work.  Worse with walking.  TTP.   Plain films are negative.  There is some bruising and he is quite tender.  Will check CT to rule out occult fx.  CT negative.  Will give cam walker and ortho follow-up.  Amount and/or Complexity of Data Reviewed Radiology: ordered and independent interpretation performed.    Details: No fx seen on x-ray, but pretty significant pain, will check CT to rule out occult heel fx.  Risk Prescription drug management.     Consultants: No consultations were needed in caring for this patient.   Treatment and Plan: Emergency department workup does not suggest an emergent condition requiring admission or immediate intervention beyond  what has been performed at this time. The patient is safe for discharge and has  been instructed to return immediately for worsening symptoms, change in  symptoms or any other concerns    Final Clinical Impressions(s) / ED Diagnoses     ICD-10-CM   1. Pain of left heel  M79.672       ED Discharge Orders          Ordered  ibuprofen (ADVIL) 600 MG tablet  Every 6 hours PRN        01/28/23 0003              Discharge Instructions Discussed with and Provided to Patient:   Discharge Instructions   None      Montine Circle, PA-C 01/28/23 0004    Deno Etienne, DO 01/28/23 0009

## 2023-01-28 MED ORDER — IBUPROFEN 600 MG PO TABS: 600.0000 mg | ORAL_TABLET | Freq: Four times a day (QID) | ORAL | 0 refills | Status: AC | PRN

## 2023-01-28 NOTE — Progress Notes (Signed)
Orthopedic Tech Progress Note Patient Details:  Isaac Barron 2001/03/16 [id number]  Ortho Devices Type of Ortho Device: Crutches, CAM walker Ortho Device/Splint Location: lle Ortho Device/Splint Interventions: Ordered, Application, Adjustment   Post Interventions Patient Tolerated: Well Instructions Provided: Care of device, Adjustment of device  Karolee Stamps 01/28/2023, 1:17 AM
